# Patient Record
Sex: Male | Born: 1935 | Race: White | Hispanic: No | Marital: Married
Health system: Southern US, Community
[De-identification: ages and names within clinical notes are randomized; demographics above are authoritative.]

---

## 2006-02-08 ENCOUNTER — Other Ambulatory Visit: Payer: Self-pay

## 2006-02-08 ENCOUNTER — Inpatient Hospital Stay: Payer: Self-pay | Admitting: Internal Medicine

## 2006-02-09 ENCOUNTER — Other Ambulatory Visit: Payer: Self-pay

## 2006-03-03 ENCOUNTER — Emergency Department: Payer: Self-pay | Admitting: Emergency Medicine

## 2006-03-04 ENCOUNTER — Other Ambulatory Visit: Payer: Self-pay

## 2006-03-15 ENCOUNTER — Ambulatory Visit: Payer: Self-pay | Admitting: Internal Medicine

## 2007-05-02 IMAGING — CR DG CHEST 2V
1 series · 2 of 2 positions shown · non-contrast
Comparison: none

REASON FOR EXAM: CVA symptoms  rm 10
COMMENTS:

[Series 1: view not recorded · 0.17mm/px · 2 of 2 slices shown]
[im 1/2]
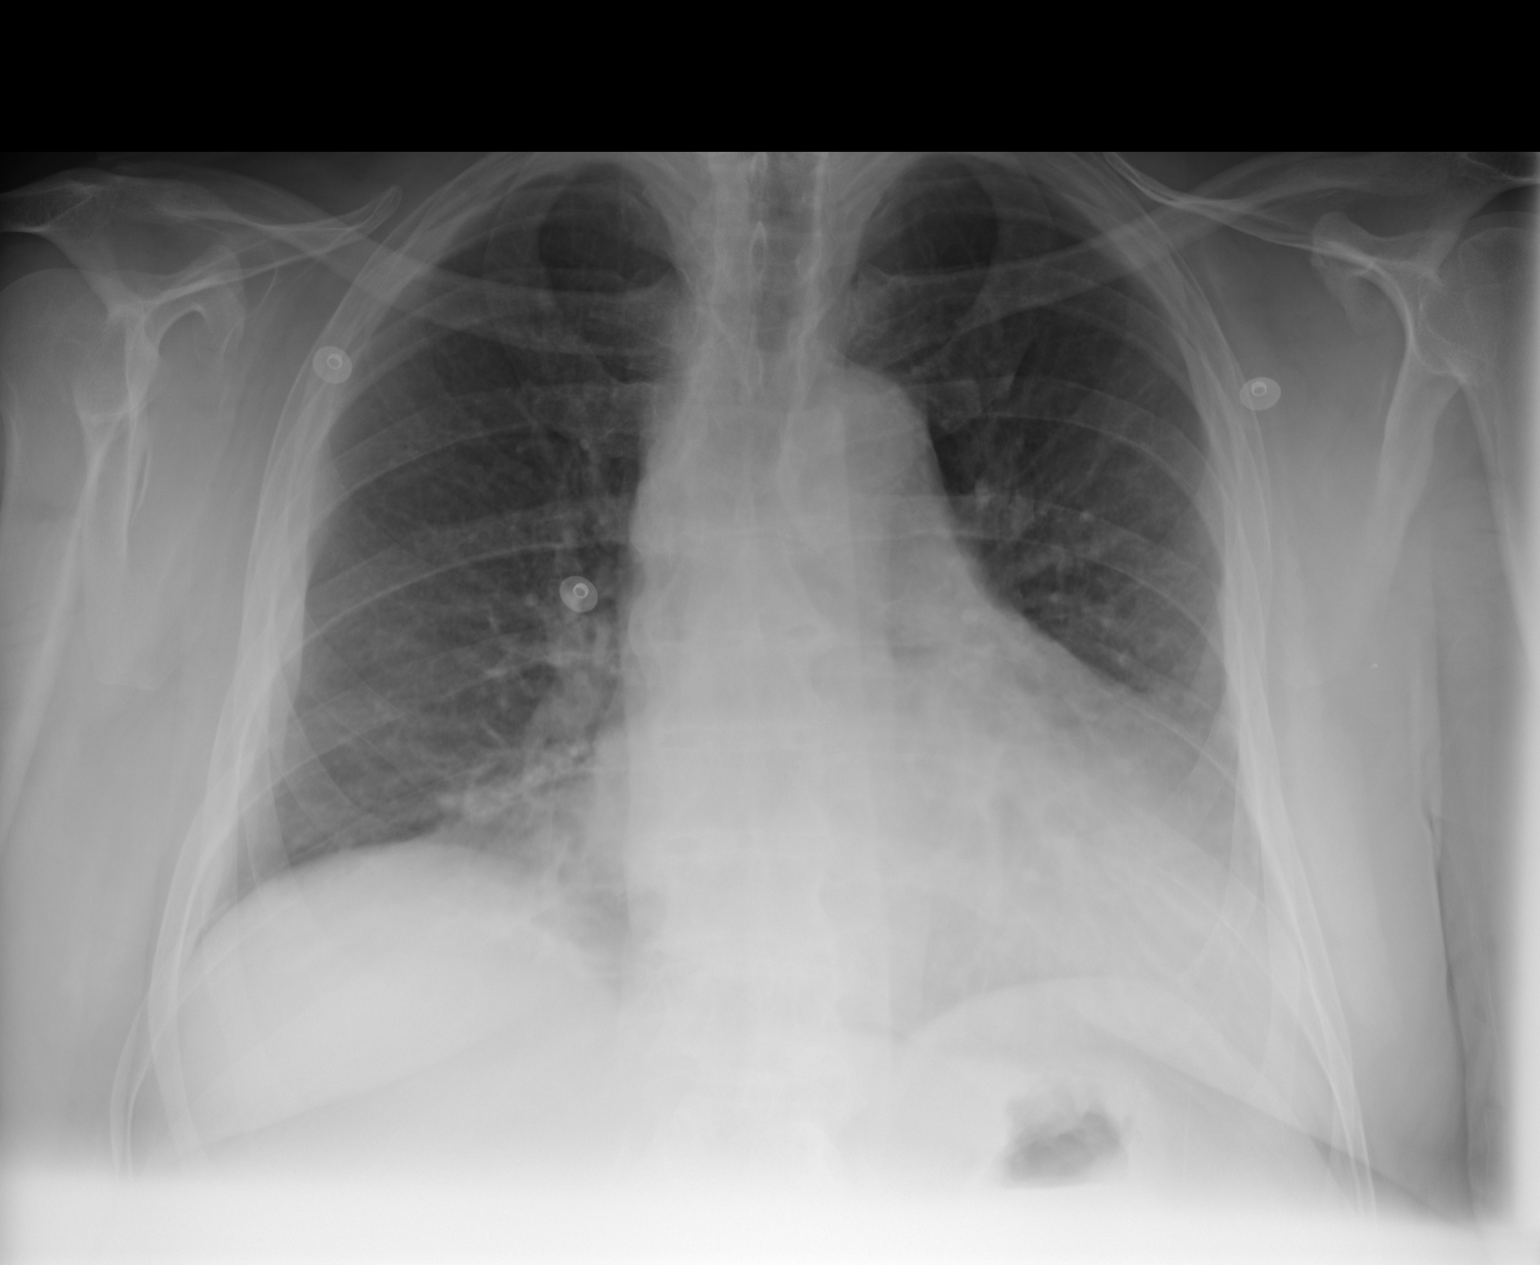
[im 2/2]
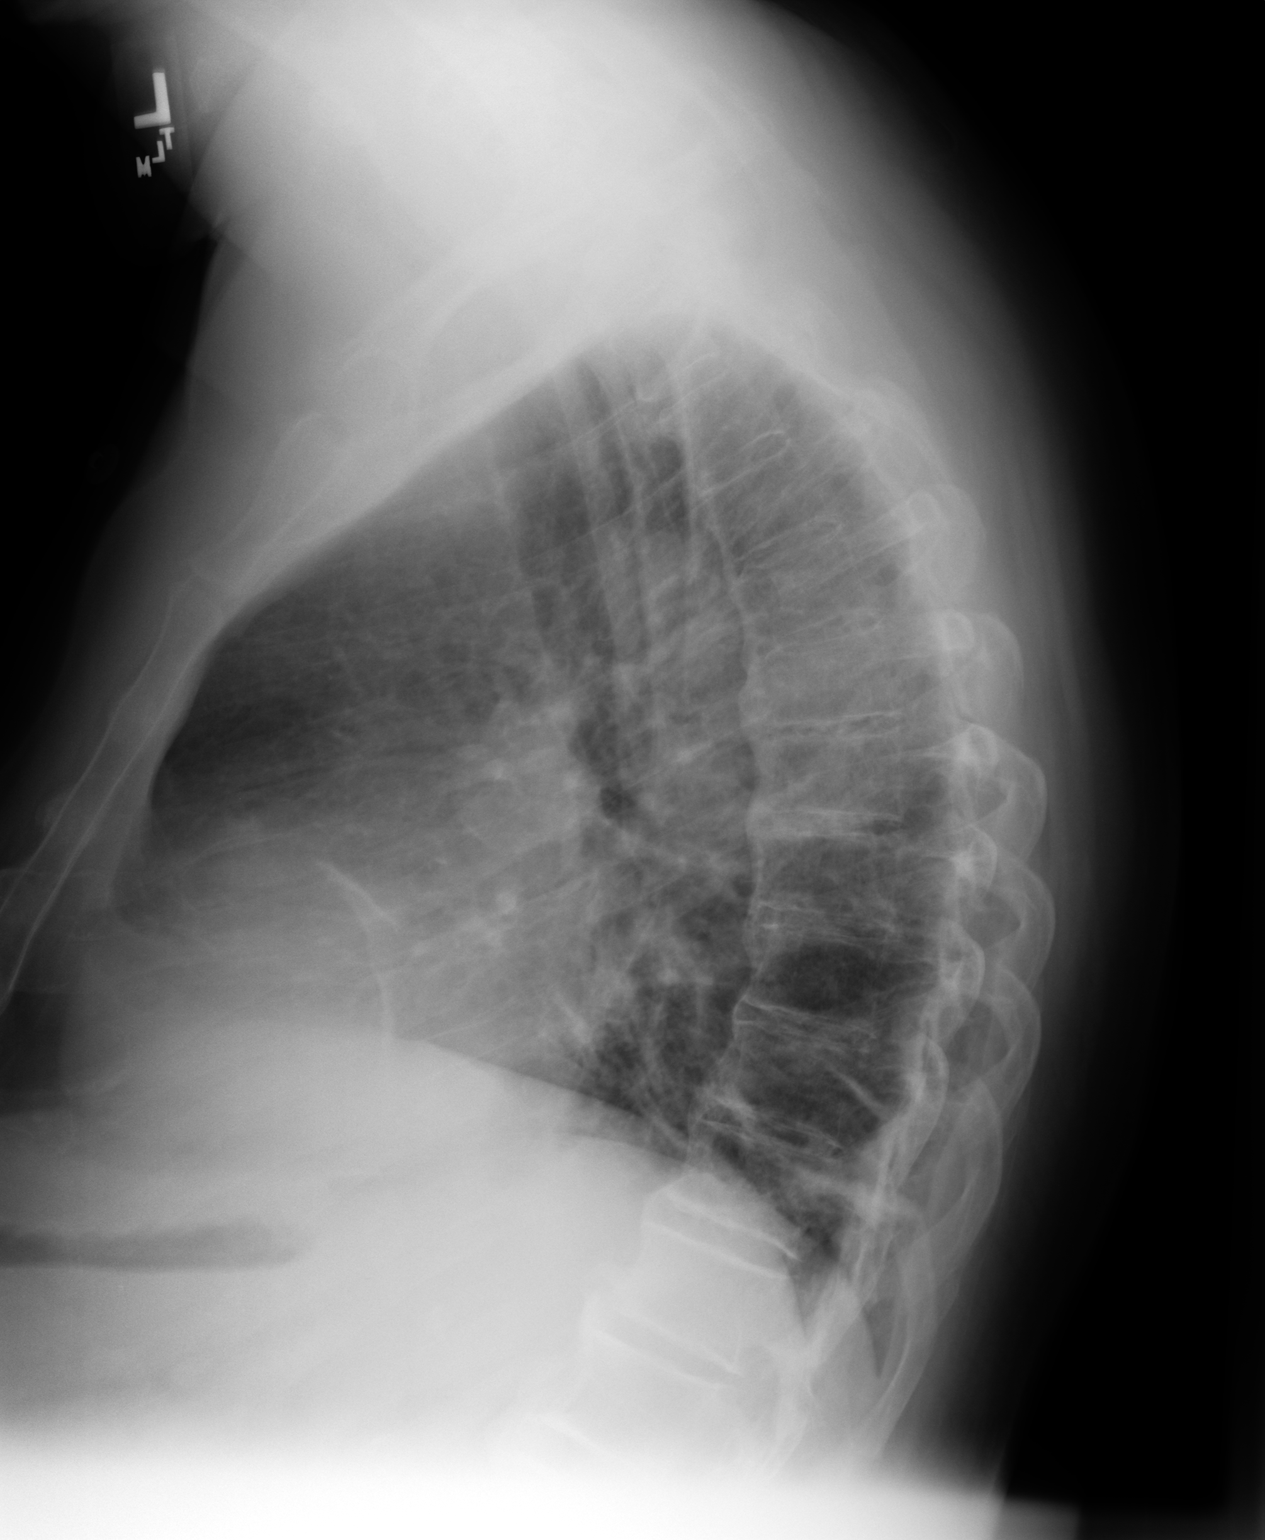

[2 of 2 positions shown; findings below may reference images not displayed]

PROCEDURE:     DXR - DXR CHEST PA (OR AP) AND LATERAL  - February 08, 2006  [DATE]

RESULT:     The lungs are adequately inflated.  There is atelectasis versus
infiltrate at the RIGHT lung base medially.  The cardiopericardial
silhouette is enlarged.  The pulmonary vascularity is prominent centrally.
IMPRESSION: There is basilar atelectasis on the RIGHT.  There is also evidence of
low-grade CHF.  A follow-up film is recommended to assure clearing.

## 2008-03-18 ENCOUNTER — Ambulatory Visit: Payer: Self-pay | Admitting: Internal Medicine

## 2008-04-06 ENCOUNTER — Inpatient Hospital Stay: Payer: Self-pay | Admitting: Internal Medicine

## 2008-05-09 ENCOUNTER — Inpatient Hospital Stay: Payer: Self-pay | Admitting: Internal Medicine

## 2008-05-21 ENCOUNTER — Inpatient Hospital Stay: Payer: Self-pay | Admitting: Unknown Physician Specialty

## 2008-05-29 ENCOUNTER — Other Ambulatory Visit: Payer: Self-pay

## 2008-06-15 ENCOUNTER — Ambulatory Visit: Payer: Self-pay | Admitting: Unknown Physician Specialty

## 2008-06-16 ENCOUNTER — Ambulatory Visit: Payer: Self-pay | Admitting: Unknown Physician Specialty

## 2008-07-16 ENCOUNTER — Ambulatory Visit: Payer: Self-pay | Admitting: Unknown Physician Specialty

## 2008-08-16 ENCOUNTER — Ambulatory Visit: Payer: Self-pay | Admitting: Unknown Physician Specialty

## 2008-09-15 ENCOUNTER — Ambulatory Visit: Payer: Self-pay | Admitting: Unknown Physician Specialty

## 2008-10-16 ENCOUNTER — Ambulatory Visit: Payer: Self-pay | Admitting: Unknown Physician Specialty

## 2008-11-16 ENCOUNTER — Ambulatory Visit: Payer: Self-pay | Admitting: Unknown Physician Specialty

## 2008-12-14 ENCOUNTER — Ambulatory Visit: Payer: Self-pay | Admitting: Unknown Physician Specialty

## 2009-01-14 ENCOUNTER — Ambulatory Visit: Payer: Self-pay | Admitting: Unknown Physician Specialty

## 2009-02-13 ENCOUNTER — Ambulatory Visit: Payer: Self-pay | Admitting: Unknown Physician Specialty

## 2009-03-16 ENCOUNTER — Ambulatory Visit: Payer: Self-pay | Admitting: Unknown Physician Specialty

## 2009-06-30 ENCOUNTER — Ambulatory Visit: Payer: Self-pay | Admitting: Internal Medicine

## 2009-07-17 ENCOUNTER — Emergency Department: Payer: Self-pay | Admitting: Emergency Medicine

## 2009-07-26 ENCOUNTER — Inpatient Hospital Stay: Payer: Self-pay | Admitting: Internal Medicine

## 2009-08-04 ENCOUNTER — Encounter: Payer: Self-pay | Admitting: Internal Medicine

## 2009-08-16 ENCOUNTER — Encounter: Payer: Self-pay | Admitting: Internal Medicine

## 2011-11-02 ENCOUNTER — Ambulatory Visit: Payer: Self-pay | Admitting: Internal Medicine

## 2012-02-24 LAB — CBC WITH DIFFERENTIAL/PLATELET
Basophil #: 0 10*3/uL (ref 0.0–0.1)
Basophil %: 0.3 %
Eosinophil %: 0.5 %
HCT: 36.9 % — ABNORMAL LOW (ref 40.0–52.0)
HGB: 12.3 g/dL — ABNORMAL LOW (ref 13.0–18.0)
Lymphocyte #: 2.1 10*3/uL (ref 1.0–3.6)
Lymphocyte %: 14.4 %
MCHC: 33.3 g/dL (ref 32.0–36.0)
MCV: 91 fL (ref 80–100)
Neutrophil #: 11.7 10*3/uL — ABNORMAL HIGH (ref 1.4–6.5)
Neutrophil %: 78.8 %
WBC: 14.9 10*3/uL — ABNORMAL HIGH (ref 3.8–10.6)

## 2012-02-24 LAB — BASIC METABOLIC PANEL
Anion Gap: 9 (ref 7–16)
BUN: 15 mg/dL (ref 7–18)
Calcium, Total: 8.6 mg/dL (ref 8.5–10.1)
Chloride: 113 mmol/L — ABNORMAL HIGH (ref 98–107)
Creatinine: 1.43 mg/dL — ABNORMAL HIGH (ref 0.60–1.30)
EGFR (African American): 55 — ABNORMAL LOW
EGFR (Non-African Amer.): 47 — ABNORMAL LOW
Glucose: 146 mg/dL — ABNORMAL HIGH (ref 65–99)
Potassium: 3.8 mmol/L (ref 3.5–5.1)

## 2012-02-24 LAB — PROTIME-INR: INR: 2.9

## 2012-02-24 LAB — LITHIUM LEVEL: Lithium: <0.2 mmol/L — ABNORMAL LOW

## 2012-02-25 ENCOUNTER — Ambulatory Visit: Payer: Self-pay | Admitting: Unknown Physician Specialty

## 2012-02-25 ENCOUNTER — Inpatient Hospital Stay: Payer: Self-pay | Admitting: Specialist

## 2012-02-25 LAB — URINALYSIS, COMPLETE
Glucose,UR: NEGATIVE mg/dL (ref 0–75)
Ketone: NEGATIVE
Leukocyte Esterase: NEGATIVE
Ph: 8 (ref 4.5–8.0)
Protein: NEGATIVE
RBC,UR: 3 /HPF (ref 0–5)
Specific Gravity: 1.003 (ref 1.003–1.030)

## 2012-02-25 LAB — PROTIME-INR
INR: 1.3
Prothrombin Time: 16.2 secs — ABNORMAL HIGH (ref 11.5–14.7)

## 2012-02-26 LAB — BASIC METABOLIC PANEL
Anion Gap: 10 (ref 7–16)
Calcium, Total: 8.6 mg/dL (ref 8.5–10.1)
Chloride: 108 mmol/L — ABNORMAL HIGH (ref 98–107)
Creatinine: 1.59 mg/dL — ABNORMAL HIGH (ref 0.60–1.30)
EGFR (African American): 48 — ABNORMAL LOW
Potassium: 3.6 mmol/L (ref 3.5–5.1)
Sodium: 144 mmol/L (ref 136–145)

## 2012-02-27 LAB — CBC WITH DIFFERENTIAL/PLATELET
Basophil #: 0 10*3/uL (ref 0.0–0.1)
Eosinophil %: 1.7 %
HCT: 31.4 % — ABNORMAL LOW (ref 40.0–52.0)
Lymphocyte #: 1.4 10*3/uL (ref 1.0–3.6)
MCH: 30.2 pg (ref 26.0–34.0)
MCHC: 32.8 g/dL (ref 32.0–36.0)
MCV: 92 fL (ref 80–100)
Monocyte #: 0.7 x10 3/mm (ref 0.2–1.0)
Neutrophil #: 10.1 10*3/uL — ABNORMAL HIGH (ref 1.4–6.5)
Neutrophil %: 81 %
RDW: 14.1 % (ref 11.5–14.5)

## 2012-02-27 LAB — BASIC METABOLIC PANEL
BUN: 19 mg/dL — ABNORMAL HIGH (ref 7–18)
Calcium, Total: 8.1 mg/dL — ABNORMAL LOW (ref 8.5–10.1)
Chloride: 106 mmol/L (ref 98–107)
Co2: 24 mmol/L (ref 21–32)
Creatinine: 1.5 mg/dL — ABNORMAL HIGH (ref 0.60–1.30)
EGFR (Non-African Amer.): 45 — ABNORMAL LOW
Osmolality: 287 (ref 275–301)
Potassium: 4 mmol/L (ref 3.5–5.1)

## 2012-02-27 LAB — PROTIME-INR: Prothrombin Time: 16.1 secs — ABNORMAL HIGH (ref 11.5–14.7)

## 2012-02-28 LAB — CBC WITH DIFFERENTIAL/PLATELET
Basophil #: 0 10*3/uL (ref 0.0–0.1)
Eosinophil #: 0.3 10*3/uL (ref 0.0–0.7)
Eosinophil %: 2.6 %
HCT: 29.5 % — ABNORMAL LOW (ref 40.0–52.0)
HGB: 9.7 g/dL — ABNORMAL LOW (ref 13.0–18.0)
Lymphocyte #: 1.5 10*3/uL (ref 1.0–3.6)
Lymphocyte %: 13.1 %
MCH: 30.1 pg (ref 26.0–34.0)
MCHC: 33.1 g/dL (ref 32.0–36.0)
MCV: 91 fL (ref 80–100)
Monocyte #: 0.6 x10 3/mm (ref 0.2–1.0)
Monocyte %: 5.7 %
Neutrophil #: 8.8 10*3/uL — ABNORMAL HIGH (ref 1.4–6.5)
Platelet: 178 10*3/uL (ref 150–440)

## 2012-02-28 LAB — BASIC METABOLIC PANEL
Anion Gap: 10 (ref 7–16)
BUN: 21 mg/dL — ABNORMAL HIGH (ref 7–18)
Calcium, Total: 8.6 mg/dL (ref 8.5–10.1)
Chloride: 111 mmol/L — ABNORMAL HIGH (ref 98–107)
Co2: 22 mmol/L (ref 21–32)
Creatinine: 1.41 mg/dL — ABNORMAL HIGH (ref 0.60–1.30)
EGFR (African American): 56 — ABNORMAL LOW
EGFR (Non-African Amer.): 48 — ABNORMAL LOW
Osmolality: 290 (ref 275–301)

## 2012-02-28 LAB — PROTIME-INR
INR: 1.4
Prothrombin Time: 17.8 secs — ABNORMAL HIGH (ref 11.5–14.7)

## 2012-02-29 LAB — CBC WITH DIFFERENTIAL/PLATELET
Basophil #: 0 10*3/uL (ref 0.0–0.1)
Basophil %: 0.4 %
Eosinophil %: 2.7 %
HGB: 9.5 g/dL — ABNORMAL LOW (ref 13.0–18.0)
Lymphocyte %: 16 %
MCHC: 33.4 g/dL (ref 32.0–36.0)
MCV: 92 fL (ref 80–100)
Monocyte #: 0.6 x10 3/mm (ref 0.2–1.0)
Monocyte %: 6.2 %
RBC: 3.09 10*6/uL — ABNORMAL LOW (ref 4.40–5.90)
RDW: 14.1 % (ref 11.5–14.5)
WBC: 10.3 10*3/uL (ref 3.8–10.6)

## 2012-02-29 LAB — PROTIME-INR: Prothrombin Time: 20.9 secs — ABNORMAL HIGH (ref 11.5–14.7)

## 2012-02-29 LAB — BASIC METABOLIC PANEL
BUN: 25 mg/dL — ABNORMAL HIGH (ref 7–18)
Calcium, Total: 8.5 mg/dL (ref 8.5–10.1)
Chloride: 112 mmol/L — ABNORMAL HIGH (ref 98–107)
EGFR (African American): 59 — ABNORMAL LOW
EGFR (Non-African Amer.): 51 — ABNORMAL LOW
Potassium: 4.5 mmol/L (ref 3.5–5.1)

## 2012-06-11 ENCOUNTER — Ambulatory Visit: Payer: Self-pay | Admitting: Internal Medicine

## 2012-10-16 DEATH — deceased

## 2013-05-17 IMAGING — CR RIGHT HIP - COMPLETE 2+ VIEW
1 series · 2 of 2 positions shown · non-contrast
Comparison: none

REASON FOR EXAM: fall with hip pain
COMMENTS:

PROCEDURE:     DXR - DXR HIP RIGHT COMPLETE  - February 24, 2012 [DATE]
RESULT:     AP and frog-leg lateral view of the right hip shows a subcapital
femoral fracture without underlying pathologic lesion. No significant
impaction, comminution or distraction is evident.

[Series 1: t hip ap right · 0.14mm/px · 2 of 2 slices shown]
[im 1/2]
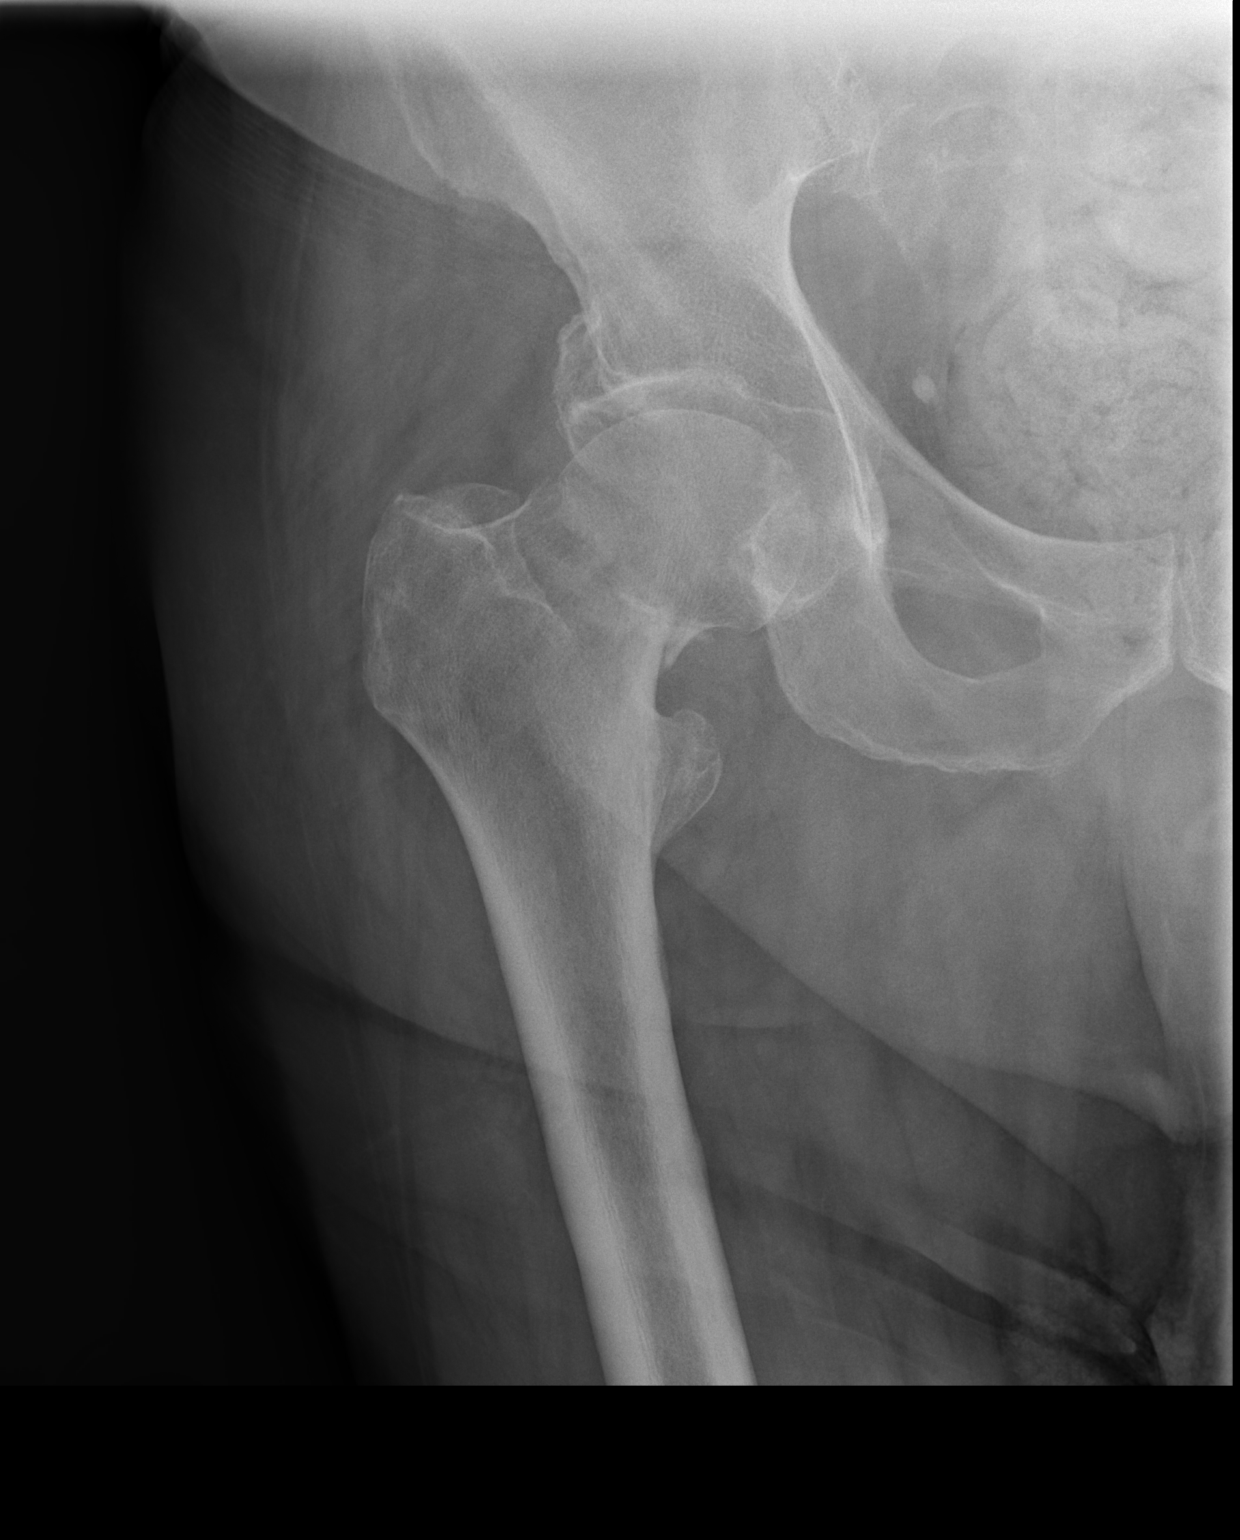
[im 2/2]
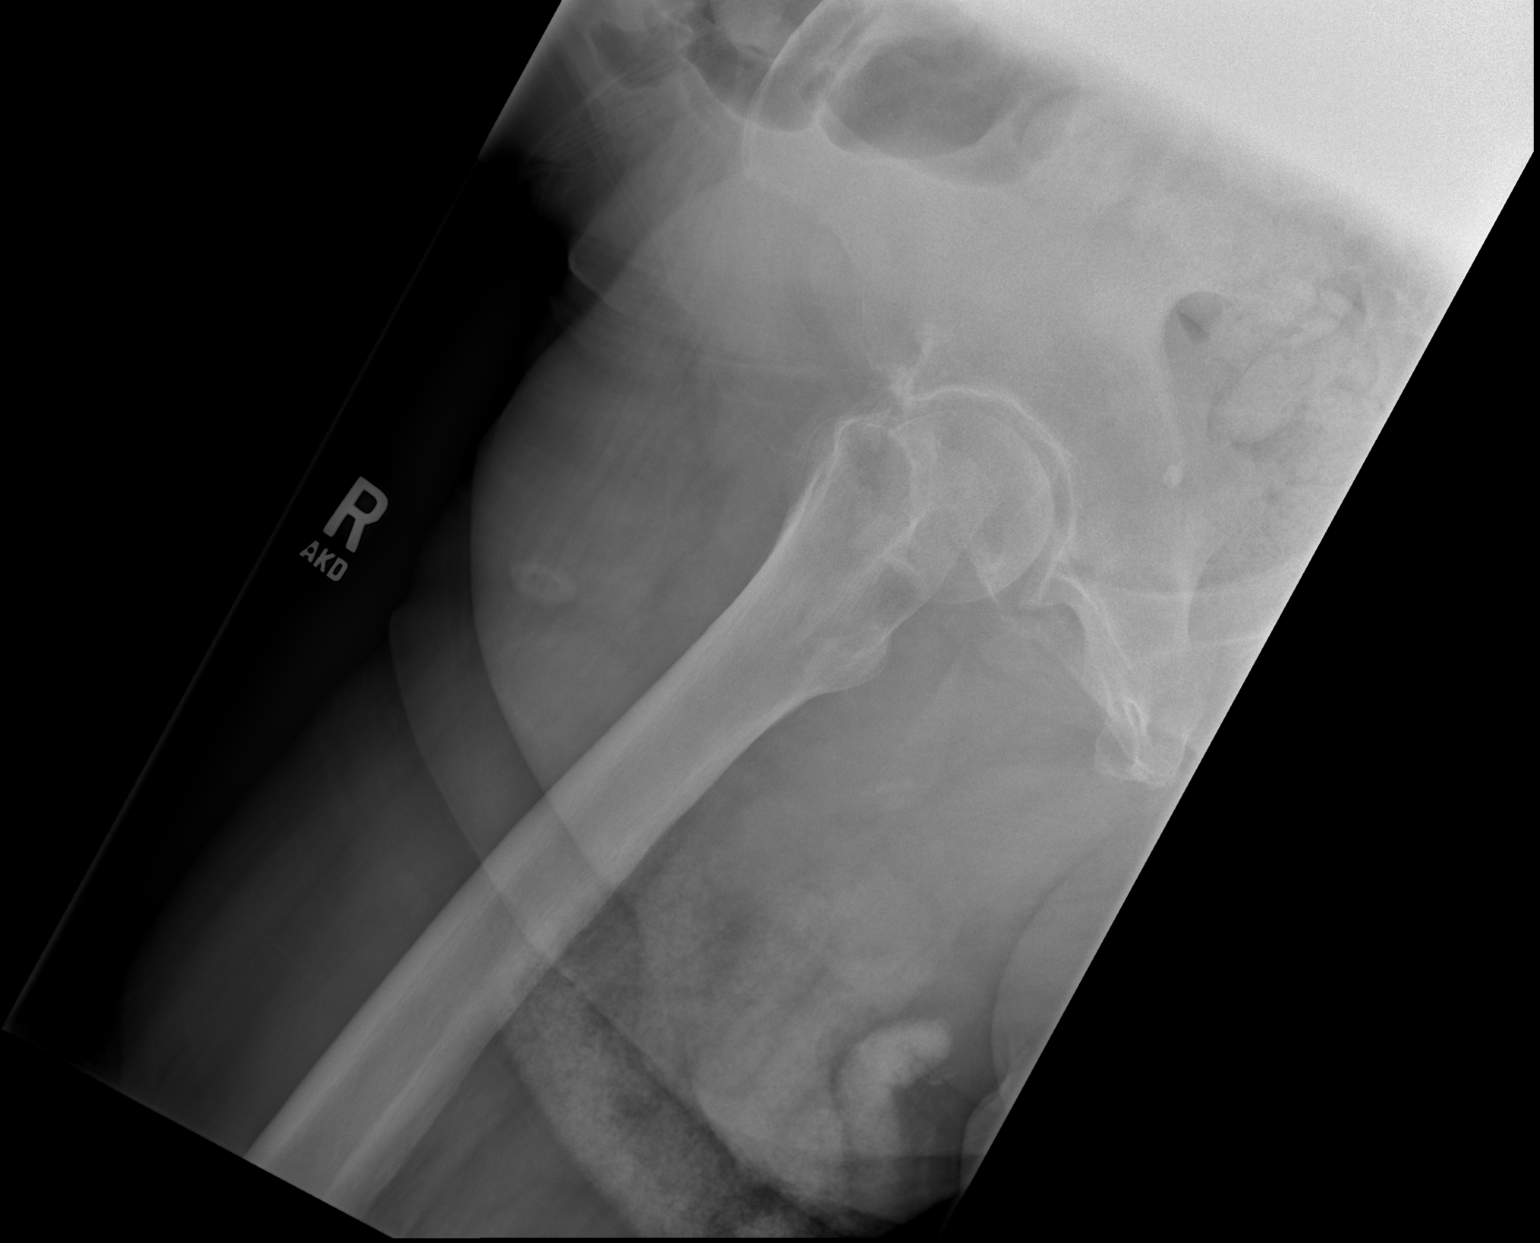

[2 of 2 positions shown; findings below may reference images not displayed]

IMPRESSION: Subcapital right femoral fracture.

[REDACTED]

## 2013-05-17 IMAGING — CR DG CHEST 1V
1 series · 1 of 1 positions shown · non-contrast
Comparison: none

REASON FOR EXAM: Chest Pain
COMMENTS:

[t chest supine]
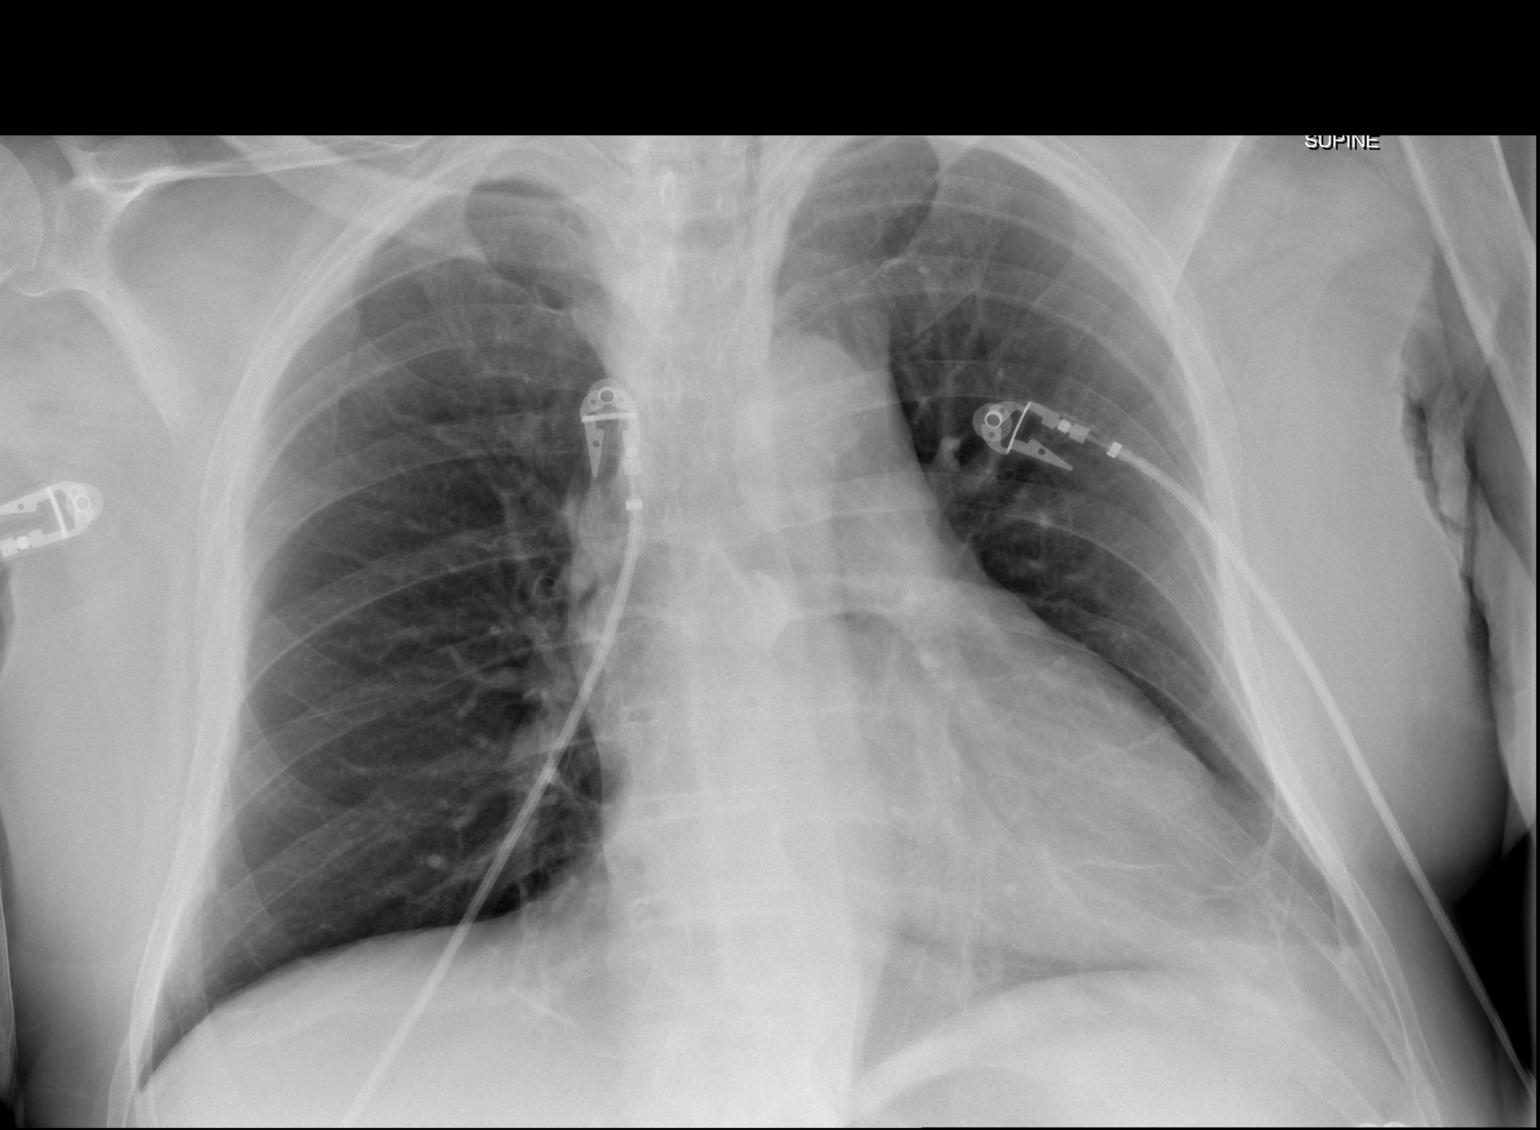

[1 of 1 positions shown; findings below may reference images not displayed]

PROCEDURE:     DXR - DXR CHEST 1 VIEWAP OR PA  - February 24, 2012 [DATE]

RESULT:     Comparison is made to the exam of 27 July, 2009.

Cardiac monitoring electrodes are present. The cardiac silhouette appears
normal for the supine anterior-posterior projection. There is no
interstitial edema, infiltrate, effusion or pneumothorax evident area the
bony structures appear to be grossly normal.
IMPRESSION: 1. No acute cardiopulmonary disease evident.

[REDACTED]

## 2013-05-17 IMAGING — CR PELVIS - 1-2 VIEW
1 series · 1 of 1 positions shown · non-contrast
Comparison: none

REASON FOR EXAM: fall injury
COMMENTS:

PROCEDURE:     DXR - DXR PELVIS AP ONLY  - February 24, 2012 [DATE]
RESULT:     AP view of the pelvis demonstrates a fracture in the proximal
right femur that appears to be subcapital. The acetabulum appears intact as
does the remainder of the pelvis. The proximal left femur appears normal.

[t pelvis ap]
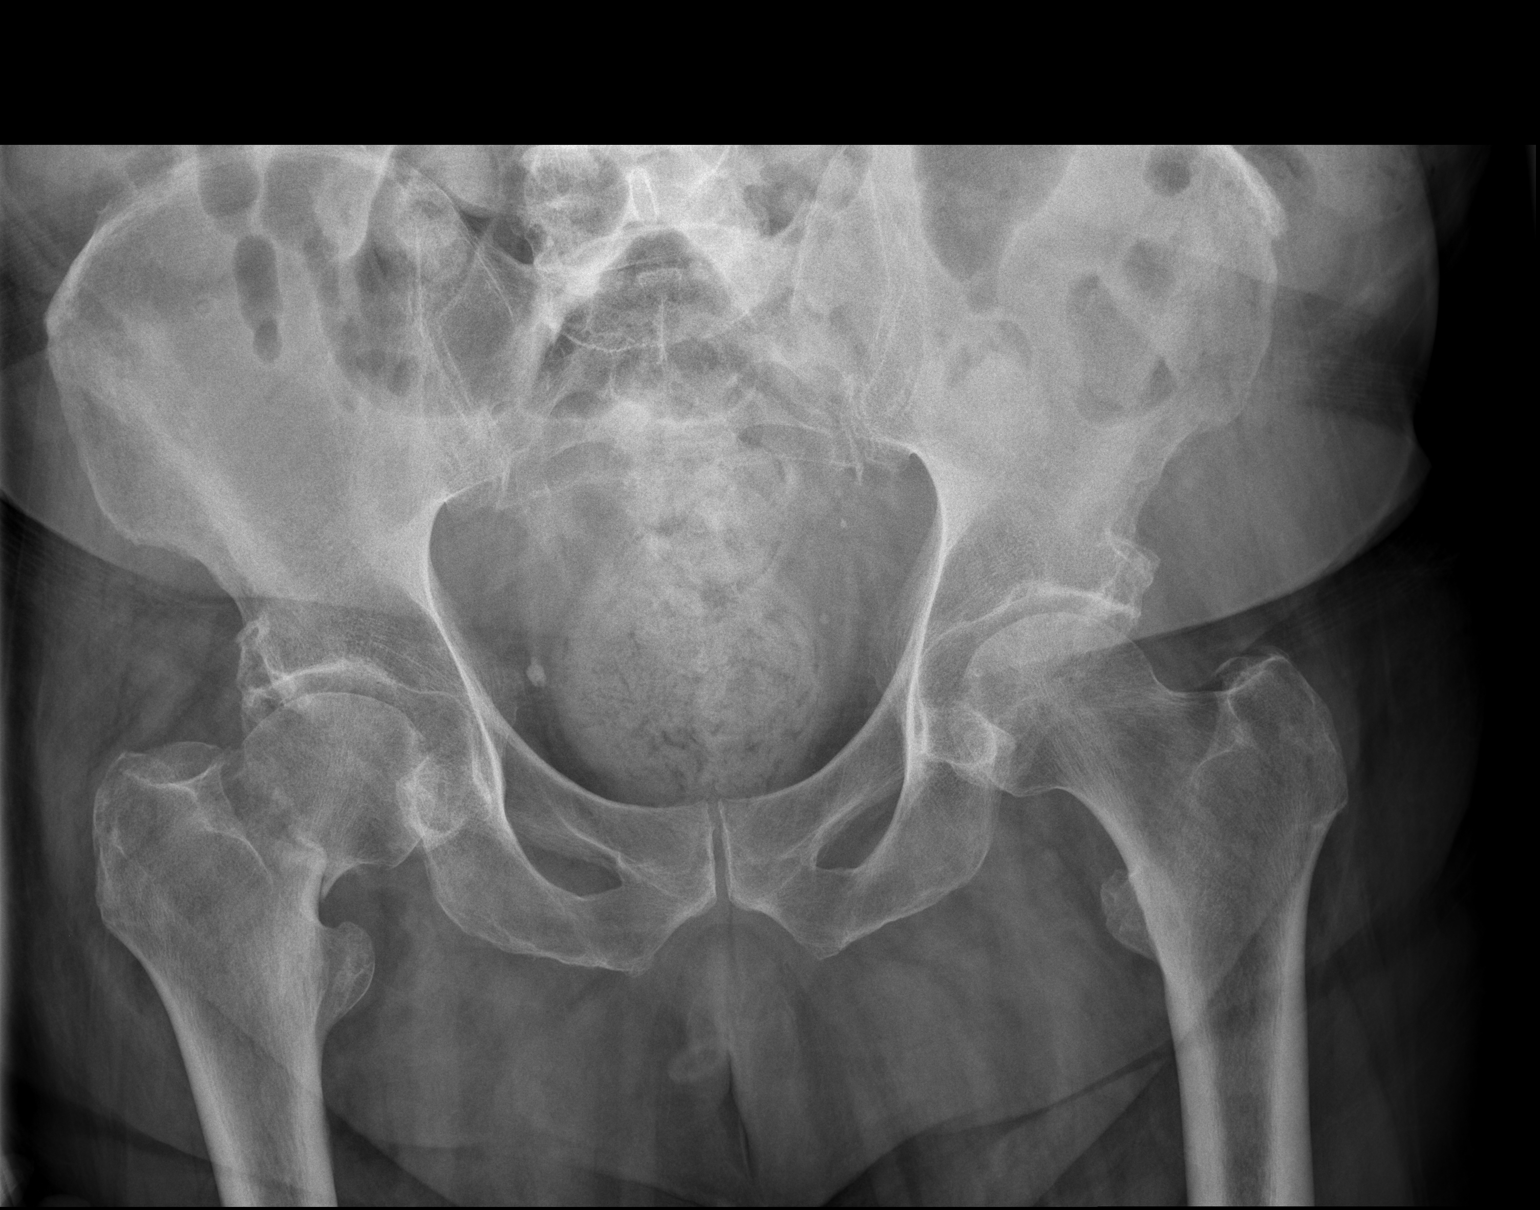

[1 of 1 positions shown; findings below may reference images not displayed]

IMPRESSION: 1. Subcapital right femoral fracture.

[REDACTED]

## 2013-05-18 IMAGING — CR PELVIS - 1-2 VIEW
1 series · 1 of 1 positions shown · non-contrast
Comparison: none

REASON FOR EXAM: postop
COMMENTS:   Bedside (portable):Y

[ap]
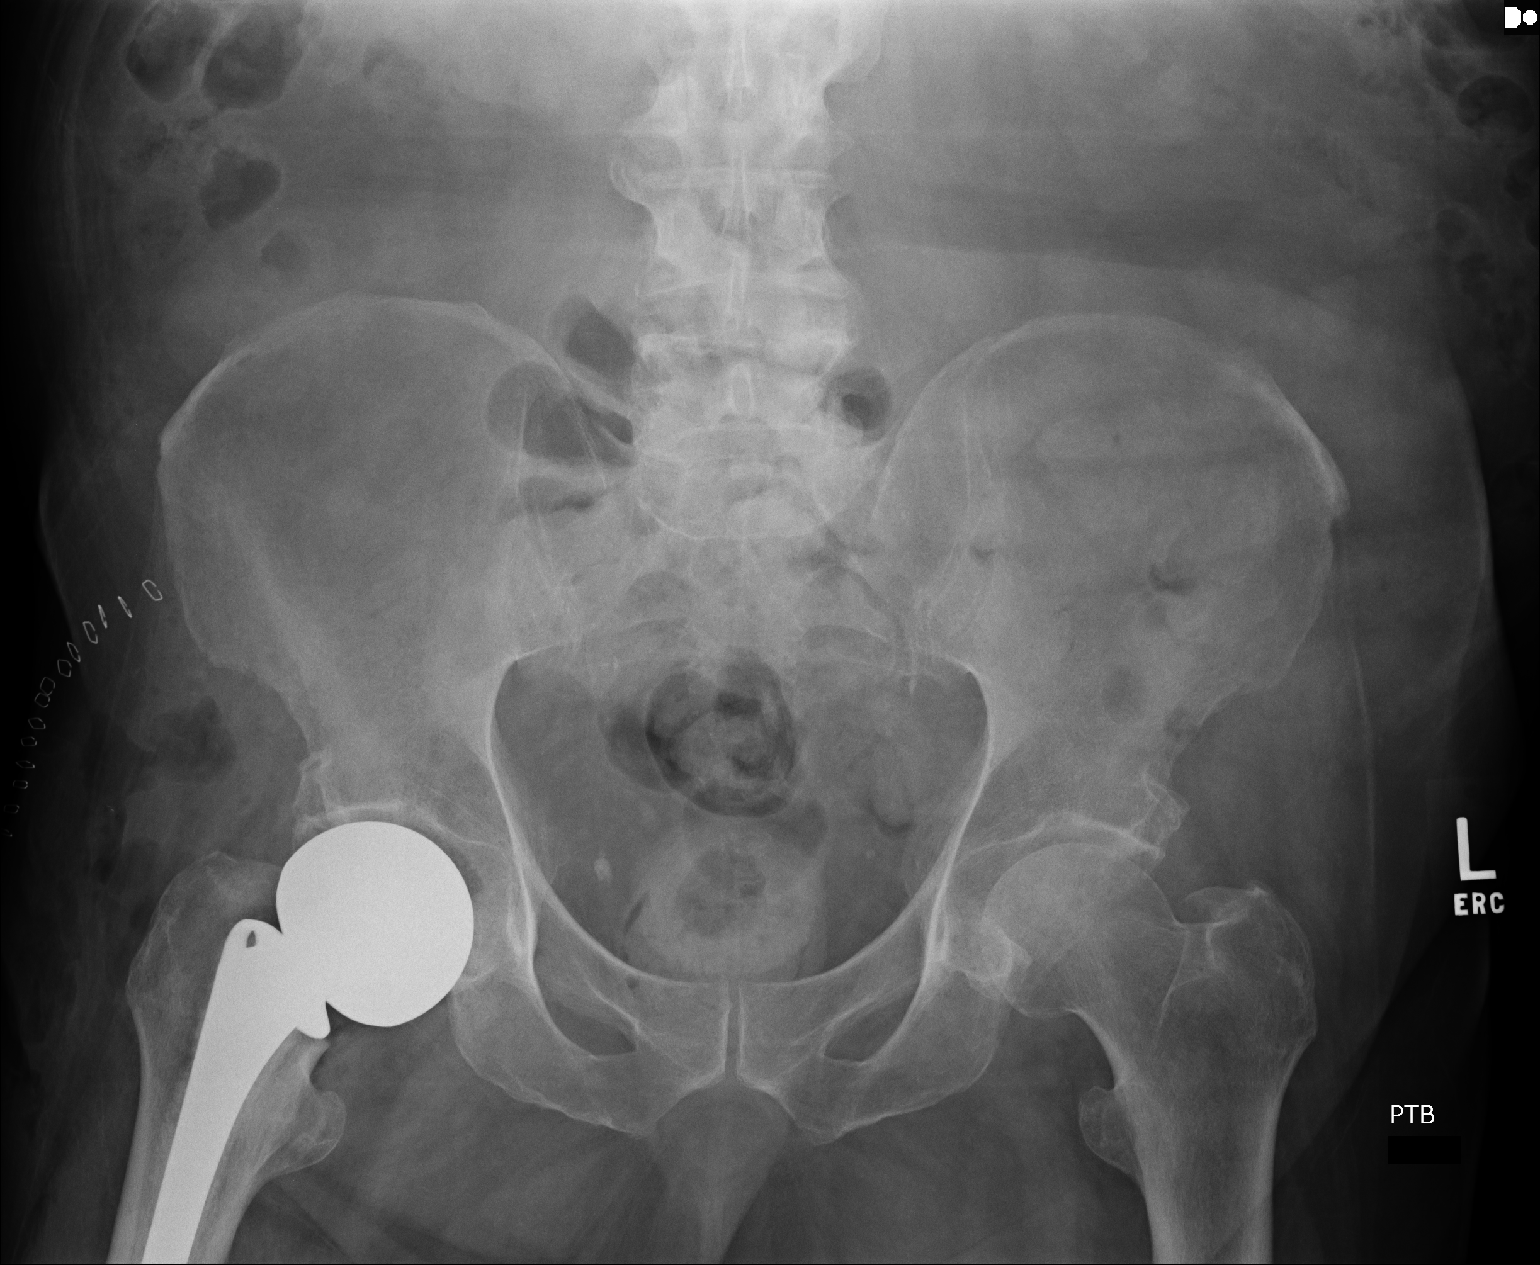

[1 of 1 positions shown; findings below may reference images not displayed]

PROCEDURE:     DXR - DXR PELVIS AP ONLY  - February 25, 2012  [DATE]

RESULT:     Comparison is made to the study February 24, 2012.

The patient has undergone interval hip joint replacement on the right.
Surgical skin staples are present. Positioning of the prosthetic joint
components is radiographically good on this single view.
IMPRESSION: The patient has undergone ORIF for a subcapital
fracture/femoral neck fracture of the right hip. Further interpretation is
deferred to Dr. Tozetti.

## 2015-02-07 NOTE — Consult Note (Signed)
PATIENT NAME:  Roger Edwards, Deovion W MR#:  [id number] DATE OF BIRTH:  05/04/1933  DATE OF CONSULTATION:  02/25/2012  REFERRING PHYSICIAN:  Ruthann CancerJames Califf, MD  CONSULTING PHYSICIAN:  Starleen Armsawood S. Awanda Wilcock, MD  PRIMARY CARE PHYSICIAN: Aram BeechamJeffrey Sparks, MD    REASON FOR CONSULTATION: Elevated INR prior to surgery and preop clearance.   HISTORY OF PRESENT ILLNESS: This is a 81 year old male with multiple medical problems including depression status post ECT, chronic atrial fibrillation on chronic warfarin therapy, possible Parkinson's disease, and history of hyperlipidemia who presented to the ED status post fall. The patient reports he did trip on carpet where he fell on his right side and then he felt excruciating pain in his right hip area where he could not stand up. Upon presentation to the ED, the patient was found to have right femoral neck fracture and admitted to Dr. Golda Acrealiff's service. The patient's INR level was 2.9 so hospitalist service were requested to consult on the patient to correct INR so the patient can proceed to hemiarthroplasty.   PAST MEDICAL HISTORY:  1. Atrial fibrillation, on anticoagulation.  2. History of type II diabetes, not on medications.  3. Depression.  4. Hyperlipidemia.  5. Benign hypertension.  6. History of CVA.  7. Parkinson's.   ALLERGIES: Haldol.   MEDICATIONS:  1. Aspirin 81 mg oral daily.  2. K-Dur 20 mEq daily.  3. Protonix 40 mg daily.  4. Crestor 20 mg at bedtime.  5. Lithium 300 mg at bedtime.  6. Warfarin 4 mg daily.  7. Megestrol 400 mg daily.   SOCIAL HISTORY: Negative for alcohol and tobacco use.   FAMILY HISTORY: Noncontributory.   REVIEW OF SYSTEMS: Denies any fever, fatigue, weakness. EYES: Denies any blurred vision, double vision, pain. ENT: Denies any tinnitus, ear pain, hearing loss. RESPIRATORY: Denies any cough, wheezing, hemoptysis. CARDIOVASCULAR: Denies any chest pain, orthopnea, edema. Has history of atrial fibrillation, on  anticoagulation. GI: Denies any nausea, vomiting, diarrhea, abdominal pain. GU: Denies any dysuria, hematuria, renal colic. ENDOCRINE: Denies any polyuria, polydipsia, heat or cold intolerance. HEMATOLOGY: Has history of atrial fibrillation on chronic anticoagulation. Denies any easy bruising. MUSCULOSKELETAL: Currently has right hip pain status post mechanical fall.  NEUROLOGIC: Denies any numbness, tremors, vertigo, ataxia. PSYCH: Has history of bipolar disorder, on lithium.   LABORATORY, DIAGNOSTIC, AND RADIOLOGICAL DATA: Glucose 146, BUN 15, creatinine 1.43, sodium 146, potassium 3.8, chloride 113, white blood cells 14.9, hemoglobin 12.3, hematocrit 36.9, platelets 212. INR 2.9.   PHYSICAL EXAMINATION:   GENERAL: Elderly male comfortable in bed in no apparent distress.   HEENT: Head atraumatic, normocephalic. Pupils equal, reactive to light. Pink conjunctivae. Anicteric sclerae. Moist oral mucosa.   NECK: Supple. No thyromegaly. No JVD.   CHEST: Good air entry bilaterally. No rales, wheezing, or rhonchi.   CARDIOVASCULAR: S1, S2 heard. No rubs, murmur, or gallops.   ABDOMEN: Soft, nontender, nondistended. Bowel sounds present.   EXTREMITIES: No edema. Has right lower extremity on traction, mildly shorter than the left.    NEUROLOGIC: Cranial nerves grossly intact, nonfocal.    VITAL SIGNS: Temperature 98.7, pulse 74, respiratory rate 18, blood pressure 125/78, pulse oximetry 95% on room air.   ASSESSMENT AND PLAN: This is a 81 year old male who presents with right femoral neck fracture secondary to mechanical fall admitted to the orthopedic service, Dr. Gerrit Heckaliff. Medical service consult was called for preop clearance and INR reversal.  1. The patient's INR is 2.9. Will give Vitamin K 10 mg IV x1 and will  give 3 units of total of FFP and then will check INR level.  2. History of Parkinson's. Continue with Sinemet.  3. Bipolar. Continue with lithium.  4. History of atrial fibrillation.  The patient is on anticoagulation, will be reversed prior to surgery. The patient's EKG does not show any P wave, does show wide QRS complexes but this is most likely secondary to right bundle branch block. Was compared to old EKG two years ago. There isn't any significant change from then. 5. The patient does not have any chest pain, any shortness of breath, is not in active arrhythmia or decompensated congestive heart failure. The patient is considered moderate risk for surgical procedure once his INR is corrected to level below 1.5.   TOTAL TIME SPENT ON PATIENT CARE: 45 minutes.   ____________________________ Starleen Arms, MD dse:drc D: 02/25/2012 05:46:20 ET T: 02/26/2012 08:27:09 ET JOB#: [id number]  cc: Starleen Arms, MD, <Dictator> Humaira Sculley Teena Irani MD ELECTRONICALLY SIGNED 02/27/2012 1:18

## 2015-02-07 NOTE — H&P (Signed)
Subjective/Chief Complaint Right hip pain    History of Present Illness Has been falling alot over the past 2 months, has been having HHPT come out. [place] again yesterday and could not walk. Brought to ER where xrays shwed a displaced femoral neck fracture. Denies other injuries.    Past History Dementia   Past Med/Surgical Hx:  Bipolar Disorder:   ECT treatments:   CVA/Stroke:   hypercholesterolemia:   hypertension:   hernia:   manic depressive:   ALLERGIES:  Haldol: Unknown  HOME MEDICATIONS: Medication Instructions Status  Allegra tablet 180 mg 5 tab(s) orally once a day as needed   Active  docusate sodium   once a day (at bedtime)  Active  Coumadin 4 mg oral tablet 1 tab(s) orally once a day Active  lithium 150 mg oral capsule 1 cap(s) orally once a day Active  megestrol 40 mg/mL oral suspension 20 milliliter(s) orally once a day Active  carbidopa 25 mg oral tablet 1 tab(s) orally 3 times a day Active  Allegra 24 Hour Allergy oral tablet 0.5 tab(s) orally once a day Active   Family and Social History:   Family History Non-Contributory    Social History negative tobacco, negative ETOH, negative Illicit drugs    Place of Living Home   Review of Systems:   Fever/Chills No    Cough No    Sputum No    Abdominal Pain No    Diarrhea No    Constipation No    Nausea/Vomiting No    SOB/DOE No    Chest Pain No    Dysuria No    Tolerating Diet Yes    Medications/Allergies Reviewed Medications/Allergies reviewed   Physical Exam:   GEN well developed    HEENT PERRL    NECK supple    RESP clear BS    CARD regular rate  no murmur    ABD denies tenderness  soft    GU foley catheter in place    EXTR Normal except right  hip which is painful, in traction    NEURO motor/sensory function intact    PSYCH alert   Routine Hem:  11-May-13 22:08    WBC (CBC) 14.9   RBC (CBC) 4.04   Hemoglobin (CBC) 12.3   Hematocrit (CBC) 36.9   Platelet Count  (CBC) 212   MCV 91   MCH 30.4   MCHC 33.3   RDW 14.0   Neutrophil % 78.8   Lymphocyte % 14.4   Monocyte % 6.0   Eosinophil % 0.5   Basophil % 0.3   Neutrophil # 11.7   Lymphocyte # 2.1   Monocyte # 0.9   Eosinophil # 0.1   Basophil # 0.0  Routine Chem:  11-May-13 22:08    Glucose, Serum 146   BUN 15   Creatinine (comp) 1.43   Sodium, Serum 146   Potassium, Serum 3.8   Chloride, Serum 113   CO2, Serum 24   Calcium (Total), Serum 8.6   Anion Gap 9   Osmolality (calc) 294   eGFR (African American) 55   eGFR (Non-African American) 47  Routine Coag:  11-May-13 22:08    Prothrombin 30.1   INR 2.9  TDMs:  11-May-13 22:08    Lithium, Serum < 0.20  Routine BB:  12-May-13 07:09    Fresh Frozen Plasma Unit 1 Issued   Fresh Frozen Plasma Unit 2 Issued   Fresh Frozen Plasma Unit 3 Ready  Routine Coag:  12-May-13 15:02  Prothrombin 16.2   INR 1.3   Radiology Results: XRay:    11-May-13 22:32, Hip Right Complete   Hip Right Complete   REASON FOR EXAM:    fall with hip pain  COMMENTS:       PROCEDURE: DXR - DXR HIP RIGHT COMPLETE  - Feb 24 2012 10:32PM     RESULT: AP and frog-leg lateral view of the right hip shows a subcapital   femoral fracture without underlying pathologic lesion. No significant   impaction, comminution or distraction is evident.    IMPRESSION:  Subcapital right femoral fracture.    Dictation Site: 6          Verified By: Sundra Aland, M.D., MD    11-May-13 22:32, Pelvis AP Only   Pelvis AP Only   REASON FOR EXAM:    fall injury  COMMENTS:       PROCEDURE: DXR - DXR PELVIS AP ONLY  - Feb 24 2012 10:32PM     RESULT: AP view of the pelvis demonstrates a fracture in the proximal   right femur that appears to be subcapital. The acetabulum appears intact   as does the remainder of the pelvis. The proximal left femur appears   normal.    IMPRESSION:   1. Subcapital right femoral fracture.    Dictation Site: 6      Verified By:  Sundra Aland, M.D., MD  CT:    17-Jan-13 16:04, CT Head Without Contrast   CT Head Without Contrast   REASON FOR EXAM:    STAT CR PGR [id number] altered mental status  pt take   Coumadin  eval bleed  COMMENTS:       PROCEDURE: CT  - CT HEAD WITHOUT CONTRAST  - Nov 02 2011  4:04PM     RESULT: Comparison:  None    Technique: Multiple axial images from theforamen magnum to the vertex   were obtained without IV contrast.    Findings:      There is no evidence of mass effect, midline shift, or extra-axial fluid   collections.  There is no evidence of a space-occupying lesion or     intracranial hemorrhage. There is no evidence of a cortical-based area of   acute infarction. There is generalized cerebral atrophy. There is   periventricular white matter low attenuation likely secondary to   microangiopathy.    The ventricles and sulci are appropriate for the patient's age. The basal   cisterns are patent.    Visualized portions of the orbits are unremarkable. The visualized   portions of the paranasal sinuses and mastoid air cells are unremarkable.   Cerebrovascular atherosclerotic calcifications are noted.    The osseous structures are unremarkable.    IMPRESSION:    No acute intracranial process.          Verified By: Jennette Banker, M.D., MD     Assessment/Admission Diagnosis 1. Displaced right femoral neck fracture 2. Recent falls 3. Afib, on coumadin, INR now corrected to 1.3 4. Dementia 5. Type 2 diabetes    Plan Hmiarthroplasty right hip  Discussed options, risks and benefits. Will plan rehab. Family understands I am here just for weekend   Electronic Signatures: Lynnda Shields (MD)  (Signed 12-May-13 16:30)  Authored: CHIEF COMPLAINT and HISTORY, PAST MEDICAL/SURGIAL HISTORY, ALLERGIES, HOME MEDICATIONS, FAMILY AND SOCIAL HISTORY, REVIEW OF SYSTEMS, PHYSICAL EXAM, LABS, Radiology, ASSESSMENT AND PLAN   Last Updated: 12-May-13 16:30 by Lynnda Shields  (MD)

## 2015-02-07 NOTE — Discharge Summary (Signed)
PATIENT NAME:  Roger Edwards, Roger Edwards MR#:  [id number] DATE OF BIRTH:  July 03, 1933  DATE OF ADMISSION:  02/25/2012 DATE OF DISCHARGE:  02/29/2012   DISCHARGE DIAGNOSES:  1. Displaced right femoral neck fracture.  2. Atrial fibrillation, on anticoagulation.  3. History of type II diabetes.  4. Depression.  5. Hyperlipidemia.  6. Benign hypertension.  7. History of cardiovascular accident.  8. Parkinson's disease.   OPERATIONS/PROCEDURES PERFORMED: Cemented right hip hemiarthroplasty on 02/25/2012 by Dr. Ruthann CancerJames Califf.   PHYSICAL EXAMINATION: As dictated on admission.   LABORATORY DATA: As noted on the chart.   HOSPITAL COURSE: The patient was evaluated medically by [facility] and cleared for surgery. The patient was treated with Vitamin K and fresh frozen plasma because of his elevated INR on Coumadin therapy. On 02/25/2012 the patient was taken to the operating room by Dr. Ruthann CancerJames Califf where cemented right hip hemiarthroplasty was performed which the patient tolerated quite well. Postoperatively the patient had a generally benign postoperative course. He was slow to mobilize to the chair because of his medical comorbidities. His laboratory data remained stable. He was placed back on his Coumadin 4 mg per day and his INR gradually elevated such that at the date of transfer on 02/29/2012 his INR was 1.8. He was felt stable at this point medically to be transferred to the rehab facility. His orders are as on the printed order sheet. He is to have physical therapy for transfers to the chair and ambulation with a walker and he may be weightbearing as tolerated. Please remove his staples in 10 days if his wound is seen to be healing in quite well. He also needs to have his pro-time monitored weekly such that his INR remains 2 to 2.5 range. He is to follow-up in three weeks with Dr. Myra Rudehristopher Ameya Vowell in the office for x-ray and physical examination.   DISPOSITION: He is to be transferred to the rehab facility  today.   ____________________________ Clare Gandyhristopher E. Kayce Chismar, MD ces:drc D: 02/29/2012 07:40:30 ET T: 02/29/2012 08:14:12 ET JOB#: [id number]  cc: Clare Gandyhristopher E. Egan Sahlin, MD, <Dictator> Clare GandyHRISTOPHER E Kalecia Hartney MD ELECTRONICALLY SIGNED 03/04/2012 17:38

## 2015-02-07 NOTE — Op Note (Signed)
PATIENT NAME:  Roger Edwards, Constantin W MR#:  [id number] DATE OF BIRTH:  [birth year]  DATE OF PROCEDURE:  02/25/2012  PREOPERATIVE DIAGNOSIS: Displaced femoral neck fracture, right hip.   POSTOPERATIVE DIAGNOSIS: Displaced femoral neck fracture, right hip.   PROCEDURE: Cemented hemiarthroplasty, right hip.   SURGEON: Winn JockJames C. Nesa Distel, MD   ASSISTANT: None.   ANESTHESIA: Spinal.   ESTIMATED BLOOD LOSS: 150 mL.  REPLACEMENT: 700 mL of Crystalloid.   DRAINS: None.   COMPLICATIONS: None.   IMPLANTS USED: [employer] ODC #6 femoral component, #3 cement restrictor, 52 mm Unitrax head with +5 neck.   BRIEF CLINICAL NOTE AND PATHOLOGY: The patient fell and suffered the above-mentioned fracture. He was evaluated in the Emergency Room. Date of injury was 05/11. He was on Coumadin for atrial fibrillation. He was evaluated by the medical service and was given Vitamin K and fresh frozen plasma. His INR corrected to 1.3 and he was brought to the operating room after options, risks, and benefits were discussed in detail.   At the time of the procedure, there was complete displacement of the fracture. The patient's bone was of fair quality. There was no evidence of acetabular damage.   PROCEDURE: Preop antibiotics, adequate spinal anesthesia, left lateral decubitus position with beanbag used, all prominences well padded. Routine prepping and draping. Appropriate time-out was called.   The routine posterior incision was made. The fascia was opened in line with the incision and sciatic nerve was identified and protected. The Charnley retractor was placed. Piriformis tagged and reflected.   The capsule was opened in a T-shaped fashion and the capsule was carefully tagged.   The intercondylar notch area was cleaned of soft tissue. The cookie cutter was used followed by tapered reaming up to size 6. Neck cut was made.   Broaching was then performed up to a size 6 which fit very nicely. Care was taken to maintain  appropriate anteversion. Calcar planing performed.   The femoral head was then removed and sizing was performed. The incision was thoroughly irrigated and acetabulum inspected.   The cement restrictor was placed. The canal was thoroughly irrigated and Neo-Synephrine soaked sponge was placed. Canal was thoroughly dried. Cement was placed distal to proximal and pressurized. The component was inserted maintaining appropriate anteversion. Pressure was maintained while cement was allowed to harden. Extraneous cement was removed and the surrounding area was explored for extraneous cement.   The head and neck were then placed. The hip was reduced and was extremely stable. The capsule was repaired with #5 Ti-Cron. The piriformis was repaired with the same. The incision was again thoroughly irrigated. Sciatic nerve was inspected without any obvious compression. The fascia was closed with #2 quill, sub-Q closed with 0 quill. Skin was closed with staples. Soft sterile dressing was applied. The patient was placed in abduction pillow and taken to the PAC-U having tolerated the procedure well. Sponge and needle counts were reported as correct prior to and after wound closure.   ____________________________ Winn JockJames C. Gerrit Heckaliff, MD jcc:drc D: 02/25/2012 19:23:31 ET T: 02/26/2012 78:[id number]:5609:28:24 ET JOB#: [id number]  cc: Winn JockJames C. Gerrit Heckaliff, MD, <Dictator> Winn JockJAMES C Samiya Mervin MD ELECTRONICALLY SIGNED 03/03/2012 23:42
